# Patient Record
Sex: Female | Born: 1980 | Race: White | Hispanic: No | Marital: Single | Smoking: Current some day smoker
Health system: Southern US, Community
[De-identification: ages and names within clinical notes are randomized; demographics above are authoritative.]

## PROBLEM LIST (undated history)

## (undated) DIAGNOSIS — IMO0001 Reserved for inherently not codable concepts without codable children: Secondary | ICD-10-CM

## (undated) DIAGNOSIS — K219 Gastro-esophageal reflux disease without esophagitis: Secondary | ICD-10-CM

---

## 2001-02-19 ENCOUNTER — Other Ambulatory Visit: Admission: RE | Admit: 2001-02-19 | Discharge: 2001-02-19 | Payer: Self-pay | Admitting: Family Medicine

## 2002-01-04 ENCOUNTER — Emergency Department (HOSPITAL_COMMUNITY): Admission: EM | Admit: 2002-01-04 | Discharge: 2002-01-04 | Payer: Self-pay | Admitting: Emergency Medicine

## 2002-10-25 ENCOUNTER — Emergency Department (HOSPITAL_COMMUNITY): Admission: EM | Admit: 2002-10-25 | Discharge: 2002-10-25 | Payer: Self-pay | Admitting: Emergency Medicine

## 2003-05-03 ENCOUNTER — Emergency Department (HOSPITAL_COMMUNITY): Admission: EM | Admit: 2003-05-03 | Discharge: 2003-05-03 | Payer: Self-pay | Admitting: Emergency Medicine

## 2009-06-06 ENCOUNTER — Emergency Department (HOSPITAL_BASED_OUTPATIENT_CLINIC_OR_DEPARTMENT_OTHER): Admission: EM | Admit: 2009-06-06 | Discharge: 2009-06-06 | Payer: Self-pay | Admitting: Emergency Medicine

## 2011-01-25 ENCOUNTER — Encounter: Payer: Self-pay | Admitting: *Deleted

## 2011-01-25 ENCOUNTER — Emergency Department (HOSPITAL_BASED_OUTPATIENT_CLINIC_OR_DEPARTMENT_OTHER)
Admission: EM | Admit: 2011-01-25 | Discharge: 2011-01-25 | Disposition: A | Discharge status: Home / Self Care | Payer: Self-pay | Attending: Emergency Medicine | Admitting: Emergency Medicine

## 2011-01-25 DIAGNOSIS — K219 Gastro-esophageal reflux disease without esophagitis: Secondary | ICD-10-CM | POA: Insufficient documentation

## 2011-01-25 DIAGNOSIS — K047 Periapical abscess without sinus: Secondary | ICD-10-CM | POA: Insufficient documentation

## 2011-01-25 DIAGNOSIS — Z79899 Other long term (current) drug therapy: Secondary | ICD-10-CM | POA: Insufficient documentation

## 2011-01-25 DIAGNOSIS — F17200 Nicotine dependence, unspecified, uncomplicated: Secondary | ICD-10-CM | POA: Insufficient documentation

## 2011-01-25 HISTORY — DX: Gastro-esophageal reflux disease without esophagitis: K21.9

## 2011-01-25 HISTORY — DX: Reserved for inherently not codable concepts without codable children: IMO0001

## 2011-01-25 MED ORDER — ONDANSETRON 4 MG PO TBDP: 4.0000 mg | ORAL_TABLET | Freq: Three times a day (TID) | ORAL | Status: AC | PRN

## 2011-01-25 MED ORDER — PENICILLIN V POTASSIUM 250 MG PO TABS: 250.0000 mg | ORAL_TABLET | Freq: Four times a day (QID) | ORAL | Status: AC

## 2011-01-25 MED ORDER — HYDROCODONE-ACETAMINOPHEN 5-500 MG PO TABS: 1.0000 | ORAL_TABLET | Freq: Four times a day (QID) | ORAL | Status: AC | PRN

## 2011-01-25 NOTE — ED Notes (Signed)
Patient states she has right lower abscessed tooth and swelling in her right side of her face for the last 2 days.

## 2011-01-25 NOTE — Discharge Instructions (Signed)
Abscessed Tooth A tooth abscess is a collection of infected fluid (pus) from a bacterial infection in the inner part of the tooth (pulp). It usually occurs at the end of the tooth's root.  CAUSES   A very bad cavity (extensive tooth decay).   Trauma to the tooth, such as a broken or chipped tooth, that allows bacteria to enter into the pulp.  SYMPTOMS  Severe pain in and around the infected tooth.   Swelling and redness around the abscessed tooth or in the mouth or face.   Tenderness.   Pus drainage.   Bad breath.   Bitter taste in the mouth.   Difficulty swallowing.   Difficulty opening the mouth.   Feeling sick to your stomach (nauseous).   Vomiting.   Chills.   Swollen neck glands.  DIAGNOSIS  A medical and dental history will be taken.   An examination will be performed by tapping on the abscessed tooth.   X-rays may be taken of the tooth to identify the abscess.  TREATMENT The goal of treatment is to eliminate the infection.   You may be prescribed antibiotic medicine to stop the infection from spreading.   A root canal may be performed to save the tooth. If the tooth cannot be saved, it may be pulled (extracted) and the abscess may be drained.  HOME CARE INSTRUCTIONS  Only take over-the-counter or prescription medicines for pain, fever, or discomfort as directed by your caregiver.   Do not drive after taking pain medicine (narcotics).   Rinse your mouth (gargle) often with salt water ( tsp salt in 8 oz of warm water) to relieve pain or swelling.   Do not apply heat to the outside of your face.   Return to your dentist for further treatment as directed.  SEEK IMMEDIATE DENTAL CARE IF:  You have a temperature by mouth above 102 F (38.9 C), not controlled by medicine.   You have chills or a very bad headache.   You have problems breathing or swallowing.   Your have trouble opening your mouth.   You develop swelling in the neck or around the eye.    Your pain is not helped by medicine.   Your pain is getting worse instead of better.  Document Released: 02/04/2005 Document Revised: 10/17/2010 Document Reviewed: 05/15/2010 [facility] Patient Information 2012 [place], [place].

## 2011-01-25 NOTE — ED Provider Notes (Signed)
History     CSN: [id number] Arrival date & time: 01/25/2011 10:27 AM   First MD Initiated Contact with Patient 01/25/11 1202      Chief Complaint  Patient presents with  . Dental Pain    (Consider location/radiation/quality/duration/timing/severity/associated sxs/prior treatment) HPI Comments: Pt states that she fracture her right lower teeth a while ago and now she is have pain and swelling to the area  Patient is a 30 y.o. female presenting with tooth pain. The history is provided by the patient. No language interpreter was used.  Dental PainThe primary symptoms include mouth pain. Primary symptoms do not include fever. The symptoms began 2 days ago. The symptoms are worsening. The symptoms are recurrent. The symptoms occur constantly.  Additional symptoms include: gum tenderness and facial swelling.    Past Medical History  Diagnosis Date  . Reflux     History reviewed. No pertinent past surgical history.  No family history on file.  History  Substance Use Topics  . Smoking status: Current Some Day Smoker -- 0.5 packs/day    Types: Cigarettes  . Smokeless tobacco: Not on file  . Alcohol Use: Yes     occassionally    OB History    Grav Para Term Preterm Abortions TAB SAB Ect Mult Living                  Review of Systems  Constitutional: Negative.  Negative for fever.  HENT: Positive for facial swelling.   Respiratory: Negative.   Cardiovascular: Negative.   Neurological: Negative.     Allergies  Review of patient's allergies indicates no known allergies.  Home Medications   Current Outpatient Rx  Name Route Sig Dispense Refill  . OMEGA-3 FATTY ACIDS 1000 MG PO CAPS Oral Take 2 g by mouth daily.      Marland Kitchen NORGESTIMATE-ETH ESTRADIOL 0.25-35 MG-MCG PO TABS Oral Take 1 tablet by mouth daily.      Marland Kitchen OMEPRAZOLE-SODIUM BICARBONATE 40-1100 MG PO CAPS Oral Take 1 capsule by mouth daily before breakfast.      . VITAMIN B-12 500 MCG PO TABS Oral Take 500 mcg by  mouth daily.      Marland Kitchen HYDROCODONE-ACETAMINOPHEN 5-500 MG PO TABS Oral Take 1-2 tablets by mouth every 6 (six) hours as needed for pain. 6 tablet 0  . ONDANSETRON 4 MG PO TBDP Oral Take 1 tablet (4 mg total) by mouth every 8 (eight) hours as needed for nausea. 10 tablet 0  . PENICILLIN V POTASSIUM 250 MG PO TABS Oral Take 1 tablet (250 mg total) by mouth 4 (four) times daily. 40 tablet 0    BP 154/93  Pulse 92  Temp(Src) 99.3 F (37.4 C) (Oral)  Resp 20  Ht 5\' 4"  (1.626 m)  Wt 200 lb (90.719 kg)  BMI 34.33 kg/m2  SpO2 100%  LMP 01/11/2011  Physical Exam  Nursing note and vitals reviewed. Constitutional: She appears well-developed and well-nourished.  HENT:  Right Ear: External ear normal.  Left Ear: External ear normal.       Pt has obvious decay to multiple right lower teeth:will swelling and a fracture noted to right lower jaw  Cardiovascular: Normal rate and regular rhythm.   Pulmonary/Chest: Effort normal and breath sounds normal.  Neurological: She is alert.    ED Course  Procedures (including critical care time)  Labs Reviewed - No data to display No results found.   1. Dental abscess       MDM  Pt  requesting nausea medication as pain medication upsets her stomach:no concern for ludwigs angina    Medical screening examination/treatment/procedure(s) were performed by non-physician practitioner and as supervising physician I was immediately available for consultation/collaboration. Osvaldo Human, M.D.     Teressa Lower, NP 01/25/11 1215  Carleene Sharples III, MD 01/25/11 2106

## 2013-08-22 ENCOUNTER — Emergency Department (HOSPITAL_BASED_OUTPATIENT_CLINIC_OR_DEPARTMENT_OTHER)
Admission: EM | Admit: 2013-08-22 | Discharge: 2013-08-22 | Discharge status: Against Medical Advice | Payer: Self-pay | Attending: Emergency Medicine | Admitting: Emergency Medicine

## 2013-08-22 ENCOUNTER — Encounter (HOSPITAL_BASED_OUTPATIENT_CLINIC_OR_DEPARTMENT_OTHER): Payer: Self-pay | Admitting: Emergency Medicine

## 2013-08-22 DIAGNOSIS — M79609 Pain in unspecified limb: Secondary | ICD-10-CM | POA: Insufficient documentation

## 2013-08-22 DIAGNOSIS — F17200 Nicotine dependence, unspecified, uncomplicated: Secondary | ICD-10-CM | POA: Insufficient documentation

## 2013-08-22 DIAGNOSIS — Z9119 Patient's noncompliance with other medical treatment and regimen: Secondary | ICD-10-CM

## 2013-08-22 DIAGNOSIS — Z5320 Procedure and treatment not carried out because of patient's decision for unspecified reasons: Secondary | ICD-10-CM

## 2013-08-22 NOTE — ED Notes (Signed)
Reports pain to left leg behind left knee. Pt has spider veins noticeable. Pt reports taking 1 ASA pta.

## 2013-09-02 NOTE — ED Provider Notes (Signed)
CSN: [id number]     Arrival date & time 08/22/13  1758 History   First MD Initiated Contact with Patient 08/22/13 2103     Chief Complaint  Patient presents with  . Leg Pain     (Consider location/radiation/quality/duration/timing/severity/associated sxs/prior Treatment) HPI  Past Medical History  Diagnosis Date  . Reflux    History reviewed. No pertinent past surgical history. No family history on file. History  Substance Use Topics  . Smoking status: Current Some Day Smoker -- 0.50 packs/day    Types: Cigarettes  . Smokeless tobacco: Not on file  . Alcohol Use: Yes     Comment: occassionally   OB History   Grav Para Term Preterm Abortions TAB SAB Ect Mult Living                 Review of Systems    Allergies  Review of patient's allergies indicates no known allergies.  Home Medications   Prior to Admission medications   Medication Sig Start Date End Date Taking? Authorizing Provider  fish oil-omega-3 fatty acids 1000 MG capsule Take 2 g by mouth daily.      Historical Provider, MD  norgestimate-ethinyl estradiol (ORTHO-CYCLEN,SPRINTEC,PREVIFEM) 0.25-35 MG-MCG tablet Take 1 tablet by mouth daily.      Historical Provider, MD  omeprazole-sodium bicarbonate (ZEGERID) 40-1100 MG per capsule Take 1 capsule by mouth daily before breakfast.      Historical Provider, MD  vitamin B-12 (CYANOCOBALAMIN) 500 MCG tablet Take 500 mcg by mouth daily.      Historical Provider, MD   BP 125/74  Pulse 80  Temp(Src) 98.7 F (37.1 C) (Oral)  Resp 18  Ht 5\' 4"  (1.626 m)  Wt 155 lb (70.308 kg)  BMI 26.59 kg/m2  SpO2 100%  LMP 08/22/2013 Physical Exam  ED Course  Procedures (including critical care time) Labs Review Labs Reviewed - No data to display  Imaging Review No results found.   EKG Interpretation None      MDM   Final diagnoses:  Left before treatment completed    Pt left after triage, before being seen by a medical provider.     Shanna CiscoMegan E Docherty,  MD 09/02/13 [phone]

## 2014-10-22 ENCOUNTER — Emergency Department (HOSPITAL_COMMUNITY)
Admission: EM | Admit: 2014-10-22 | Discharge: 2014-10-22 | Disposition: A | Discharge status: Home / Self Care | Payer: Self-pay | Attending: Emergency Medicine | Admitting: Emergency Medicine

## 2014-10-22 ENCOUNTER — Encounter (HOSPITAL_COMMUNITY): Payer: Self-pay | Admitting: Emergency Medicine

## 2014-10-22 DIAGNOSIS — Z880 Allergy status to penicillin: Secondary | ICD-10-CM | POA: Insufficient documentation

## 2014-10-22 DIAGNOSIS — Z79899 Other long term (current) drug therapy: Secondary | ICD-10-CM | POA: Insufficient documentation

## 2014-10-22 DIAGNOSIS — R067 Sneezing: Secondary | ICD-10-CM | POA: Insufficient documentation

## 2014-10-22 DIAGNOSIS — J0100 Acute maxillary sinusitis, unspecified: Secondary | ICD-10-CM | POA: Insufficient documentation

## 2014-10-22 DIAGNOSIS — J039 Acute tonsillitis, unspecified: Secondary | ICD-10-CM | POA: Insufficient documentation

## 2014-10-22 DIAGNOSIS — Z720 Tobacco use: Secondary | ICD-10-CM | POA: Insufficient documentation

## 2014-10-22 DIAGNOSIS — Z793 Long term (current) use of hormonal contraceptives: Secondary | ICD-10-CM | POA: Insufficient documentation

## 2014-10-22 MED ORDER — CEPHALEXIN 500 MG PO CAPS: 500.0000 mg | ORAL_CAPSULE | Freq: Four times a day (QID) | ORAL | Status: AC

## 2014-10-22 MED ORDER — TRIAMCINOLONE ACETONIDE 55 MCG/ACT NA AERO: 2.0000 | INHALATION_SPRAY | Freq: Every day | NASAL | Status: AC

## 2014-10-22 NOTE — ED Notes (Signed)
PT c/o nasal congestion and sore throat with yellow drainage worsening over the past week. PT states she has been taking zyrtec at night and Claritin D in the am.

## 2014-10-22 NOTE — ED Provider Notes (Signed)
CSN: [id number]     Arrival date & time 10/22/14  1641 History   First MD Initiated Contact with Patient 10/22/14 1716     Chief Complaint  Patient presents with  . Nasal Congestion     (Consider location/radiation/quality/duration/timing/severity/associated sxs/prior Treatment) Patient is a 34 y.o. female presenting with URI. The history is provided by the patient.  URI Presenting symptoms: congestion, rhinorrhea and sore throat   Presenting symptoms: no cough and no fever   Severity:  Moderate Onset quality:  Gradual Duration:  3 weeks Timing:  Constant Progression:  Worsening Chronicity:  New Relieved by:  Nothing Worsened by:  Nothing tried Ineffective treatments:  Decongestant and OTC medications Associated symptoms: headaches, sinus pain and sneezing   Associated symptoms: no wheezing    Mackenzie Martin is a 34 y.o. female who presents to the ED with nasal congestion, sore throat that started 3 weeks ago and has gotten worse. She is taking Zyrtec at night and Claritin D in the mornings without relief. Patient states she had a bad sinus infection last year and this feels similar. She is tender around her eyes and has thick yellow nasal drainage.    Past Medical History  Diagnosis Date  . Reflux    History reviewed. No pertinent past surgical history. History reviewed. No pertinent family history. Social History  Substance Use Topics  . Smoking status: Current Some Day Smoker -- 0.50 packs/day    Types: Cigarettes  . Smokeless tobacco: None  . Alcohol Use: Yes     Comment: occassionally   OB History    Gravida Para Term Preterm AB TAB SAB Ectopic Multiple Living            0     Review of Systems  Constitutional: Negative for fever and chills.  HENT: Positive for congestion, rhinorrhea, sneezing and sore throat.   Eyes: Negative for redness and visual disturbance.  Respiratory: Negative for cough, shortness of breath and wheezing.   Genitourinary: Negative.    Skin: Negative for rash.  Neurological: Positive for headaches. Negative for syncope and light-headedness.  Psychiatric/Behavioral: Negative for confusion. The patient is not nervous/anxious.       Allergies  Amoxicillin  Home Medications   Prior to Admission medications   Medication Sig Start Date End Date Taking? Authorizing Provider  cephALEXin (KEFLEX) 500 MG capsule Take 1 capsule (500 mg total) by mouth 4 (four) times daily. 10/22/14   Hope Orlene Och, NP  fish oil-omega-3 fatty acids 1000 MG capsule Take 2 g by mouth daily.      Historical Provider, MD  norgestimate-ethinyl estradiol (ORTHO-CYCLEN,SPRINTEC,PREVIFEM) 0.25-35 MG-MCG tablet Take 1 tablet by mouth daily.      Historical Provider, MD  omeprazole-sodium bicarbonate (ZEGERID) 40-1100 MG per capsule Take 1 capsule by mouth daily before breakfast.      Historical Provider, MD  triamcinolone (NASACORT AQ) 55 MCG/ACT AERO nasal inhaler Place 2 sprays into the nose daily. 10/22/14   Hope Orlene Och, NP  vitamin B-12 (CYANOCOBALAMIN) 500 MCG tablet Take 500 mcg by mouth daily.      Historical Provider, MD   BP 110/54 mmHg  Pulse 88  Temp(Src) 97.9 F (36.6 C) (Oral)  Resp 18  Ht  (1.626 m)  Wt 156 lb (70.761 kg)  BMI 26.76 kg/m2  SpO2 100%  LMP 10/04/2014 Physical Exam  Constitutional: She is oriented to person, place, and time. She appears well-developed and well-nourished.  HENT:  Head: Normocephalic.  Right Ear:  Tympanic membrane normal.  Left Ear: Tympanic membrane normal.  Nose: Mucosal edema and rhinorrhea present. Right sinus exhibits maxillary sinus tenderness and frontal sinus tenderness. Left sinus exhibits maxillary sinus tenderness and frontal sinus tenderness.  Mouth/Throat: Uvula is midline and mucous membranes are normal.  Kissing tonsils  Eyes: Conjunctivae and EOM are normal. Pupils are equal, round, and reactive to light.  Neck: Neck supple.  Cardiovascular: Normal rate and regular rhythm.    Pulmonary/Chest: Effort normal and breath sounds normal.  Abdominal: Soft. There is no tenderness.  Musculoskeletal: Normal range of motion.  Lymphadenopathy:    She has cervical adenopathy.  Neurological: She is alert and oriented to person, place, and time. No cranial nerve deficit.  Skin: Skin is warm and dry.  Psychiatric: She has a normal mood and affect. Her behavior is normal.  Nursing note and vitals reviewed.   ED Course  Procedures  MDM  34 y.o. female with sinus tenderness, congestion, enlarged tonsils x 3 weeks. Stable for d/c without tonsillar abscess or difficulty swallowing. Will treat for infection and she will follow up with ENT. She will return here as needed for worsening symptoms.   Final diagnoses:  Acute maxillary sinusitis, recurrence not specified  Tonsillitis        Janne Napoleon, NP 10/22/14 1803  Samuel Jester, DO 10/25/14 1756

## 2014-10-22 NOTE — Discharge Instructions (Signed)
Follow up with Dr. Suszanne Conners.  Return here as needed.

## 2014-10-22 NOTE — ED Notes (Signed)
Pt made aware to return if symptoms worsen or if any life threatening symptoms occur.   

## 2015-06-13 ENCOUNTER — Encounter (HOSPITAL_COMMUNITY): Payer: Self-pay

## 2015-06-13 ENCOUNTER — Emergency Department (HOSPITAL_COMMUNITY)
Admission: EM | Admit: 2015-06-13 | Discharge: 2015-06-13 | Disposition: A | Discharge status: Home / Self Care | Payer: Self-pay | Attending: Emergency Medicine | Admitting: Emergency Medicine

## 2015-06-13 DIAGNOSIS — K219 Gastro-esophageal reflux disease without esophagitis: Secondary | ICD-10-CM | POA: Insufficient documentation

## 2015-06-13 DIAGNOSIS — F17210 Nicotine dependence, cigarettes, uncomplicated: Secondary | ICD-10-CM | POA: Insufficient documentation

## 2015-06-13 DIAGNOSIS — Z880 Allergy status to penicillin: Secondary | ICD-10-CM | POA: Insufficient documentation

## 2015-06-13 DIAGNOSIS — Z793 Long term (current) use of hormonal contraceptives: Secondary | ICD-10-CM | POA: Insufficient documentation

## 2015-06-13 DIAGNOSIS — Z79899 Other long term (current) drug therapy: Secondary | ICD-10-CM | POA: Insufficient documentation

## 2015-06-13 DIAGNOSIS — Z7952 Long term (current) use of systemic steroids: Secondary | ICD-10-CM | POA: Insufficient documentation

## 2015-06-13 DIAGNOSIS — Y998 Other external cause status: Secondary | ICD-10-CM | POA: Insufficient documentation

## 2015-06-13 DIAGNOSIS — Z792 Long term (current) use of antibiotics: Secondary | ICD-10-CM | POA: Insufficient documentation

## 2015-06-13 DIAGNOSIS — W57XXXA Bitten or stung by nonvenomous insect and other nonvenomous arthropods, initial encounter: Secondary | ICD-10-CM | POA: Insufficient documentation

## 2015-06-13 DIAGNOSIS — S70362A Insect bite (nonvenomous), left thigh, initial encounter: Secondary | ICD-10-CM | POA: Insufficient documentation

## 2015-06-13 DIAGNOSIS — Y9289 Other specified places as the place of occurrence of the external cause: Secondary | ICD-10-CM | POA: Insufficient documentation

## 2015-06-13 DIAGNOSIS — Y9389 Activity, other specified: Secondary | ICD-10-CM | POA: Insufficient documentation

## 2015-06-13 MED ORDER — TETANUS-DIPHTH-ACELL PERTUSSIS 5-2.5-18.5 LF-MCG/0.5 IM SUSP
0.5000 mL | Freq: Once | INTRAMUSCULAR | Status: DC
  Filled 2015-06-13: qty 0.5

## 2015-06-13 MED ORDER — DOXYCYCLINE HYCLATE 100 MG PO CAPS: 100.0000 mg | ORAL_CAPSULE | Freq: Two times a day (BID) | ORAL | Status: AC

## 2015-06-13 NOTE — Discharge Instructions (Signed)
Tick Bite Information Ticks are insects that attach themselves to the skin and draw blood for food. There are various types of ticks. Common types include wood ticks and deer ticks. Most ticks live in shrubs and grassy areas. Ticks can climb onto your body when you make contact with leaves or grass where the tick is waiting. The most common places on the body for ticks to attach themselves are the scalp, neck, armpits, waist, and groin. Most tick bites are harmless, but sometimes ticks carry germs that cause diseases. These germs can be spread to a person during the tick's feeding process. The chance of a disease spreading through a tick bite depends on:   The type of tick.  Time of year.   How long the tick is attached.   Geographic location.  HOW CAN YOU PREVENT TICK BITES? Take these steps to help prevent tick bites when you are outdoors:  Wear protective clothing. Long sleeves and long pants are best.   Wear white clothes so you can see ticks more easily.  Tuck your pant legs into your socks.   If walking on a trail, stay in the middle of the trail to avoid brushing against bushes.  Avoid walking through areas with long grass.  Put insect repellent on all exposed skin and along boot tops, pant legs, and sleeve cuffs.   Check clothing, hair, and skin repeatedly and before going inside.   Brush off any ticks that are not attached.  Take a shower or bath as soon as possible after being outdoors.  WHAT IS THE PROPER WAY TO REMOVE A TICK? Ticks should be removed as soon as possible to help prevent diseases caused by tick bites. 1. If latex gloves are available, put them on before trying to remove a tick.  2. Using fine-point tweezers, grasp the tick as close to the skin as possible. You may also use curved forceps or a tick removal tool. Grasp the tick as close to its head as possible. Avoid grasping the tick on its body. 3. Pull gently with steady upward pressure until  the tick lets go. Do not twist the tick or jerk it suddenly. This may break off the tick's head or mouth parts. 4. Do not squeeze or crush the tick's body. This could force disease-carrying fluids from the tick into your body.  5. After the tick is removed, wash the bite area and your hands with soap and water or other disinfectant such as alcohol. 6. Apply a small amount of antiseptic cream or ointment to the bite site.  7. Wash and disinfect any instruments that were used.  Do not try to remove a tick by applying a hot match, petroleum jelly, or fingernail polish to the tick. These methods do not work and may increase the chances of disease being spread from the tick bite.  WHEN SHOULD YOU SEEK MEDICAL CARE? Contact your health care provider if you are unable to remove a tick from your skin or if a part of the tick breaks off and is stuck in the skin.  After a tick bite, you need to be aware of signs and symptoms that could be related to diseases spread by ticks. Contact your health care provider if you develop any of the following in the days or weeks after the tick bite:  Unexplained fever.  Rash. A circular rash that appears days or weeks after the tick bite may indicate the possibility of Lyme disease. The rash may resemble   a target with a bull's-eye and may occur at a different part of your body than the tick bite.  Redness and swelling in the area of the tick bite.   Tender, swollen lymph glands.   Diarrhea.   Weight loss.   Cough.   Fatigue.   Muscle, joint, or bone pain.   Abdominal pain.   Headache.   Lethargy or a change in your level of consciousness.  Difficulty walking or moving your legs.   Numbness in the legs.   Paralysis.  Shortness of breath.   Confusion.   Repeated vomiting.    This information is not intended to replace advice given to you by your health care provider. Make sure you discuss any questions you have with your health  care provider.   Document Released: 02/02/2000 Document Revised: 02/25/2014 Document Reviewed: 07/15/2012 Elsevier Interactive Patient Education 2016 [employer]  

## 2015-06-13 NOTE — ED Notes (Signed)
Pt here with tick bite rash.  Pulled off on Saturday.  Mowed grass on Friday.  Area is red and hot.  Elevated.

## 2015-06-13 NOTE — ED Notes (Signed)
Patient was alert, oriented and stable upon discharge. RN went over AVS and patient had no further questions.  

## 2015-06-13 NOTE — ED Provider Notes (Signed)
CSN: [id number]     Arrival date & time 06/13/15  1626 History  By signing my name below, I, Emmanuella Mensah, attest that this documentation has been prepared under the direction and in the presence of Will Reyanna Baley, PA-C. Electronically Signed: Angelene GiovanniEmmanuella Mensah, ED Scribe. 06/13/2015. 6:48 PM.     Chief Complaint  Patient presents with  . Rash   The history is provided by the patient. No language interpreter was used.   HPI Comments: Mackenzie Martin is a 35 y.o. female who presents to the Emergency Department complaining of gradually improving non-itchy rash to the face, neck, and right torso onset 2 days ago. She explains that she saw a tick on her left lateral thigh after mowing the lawn 4 days ago and she pulled it off.  No alleviating factors noted. Pt has not tried any medications PTA. She reports NKDA, denying allergy to Amoxicillin. She denies any fever, generalized body aches, n/v/d, cough, or SOB.   Past Medical History  Diagnosis Date  . Reflux    History reviewed. No pertinent past surgical history. History reviewed. No pertinent family history. Social History  Substance Use Topics  . Smoking status: Current Some Day Smoker -- 0.50 packs/day    Types: Cigarettes  . Smokeless tobacco: None  . Alcohol Use: Yes     Comment: occassionally   OB History    Gravida Para Term Preterm AB TAB SAB Ectopic Multiple Living            0     Review of Systems  Constitutional: Negative for fever and chills.  HENT: Negative for trouble swallowing.   Respiratory: Negative for shortness of breath.   Gastrointestinal: Negative for nausea, vomiting and diarrhea.  Genitourinary: Negative for dysuria.  Musculoskeletal: Negative for myalgias, arthralgias, neck pain and neck stiffness.  Skin: Positive for rash.  Neurological: Negative for headaches.      Allergies  Amoxicillin  Home Medications   Prior to Admission medications   Medication Sig Start Date End Date Taking?  Authorizing Provider  cephALEXin (KEFLEX) 500 MG capsule Take 1 capsule (500 mg total) by mouth 4 (four) times daily. 10/22/14   Hope Orlene OchM Neese, NP  doxycycline (VIBRAMYCIN) 100 MG capsule Take 1 capsule (100 mg total) by mouth 2 (two) times daily. 06/13/15   Everlene FarrierWilliam Elnore Cosens, PA-C  fish oil-omega-3 fatty acids 1000 MG capsule Take 2 g by mouth daily.      Historical Provider, MD  norgestimate-ethinyl estradiol (ORTHO-CYCLEN,SPRINTEC,PREVIFEM) 0.25-35 MG-MCG tablet Take 1 tablet by mouth daily.      Historical Provider, MD  omeprazole-sodium bicarbonate (ZEGERID) 40-1100 MG per capsule Take 1 capsule by mouth daily before breakfast.      Historical Provider, MD  triamcinolone (NASACORT AQ) 55 MCG/ACT AERO nasal inhaler Place 2 sprays into the nose daily. 10/22/14   Hope Orlene OchM Neese, NP  vitamin B-12 (CYANOCOBALAMIN) 500 MCG tablet Take 500 mcg by mouth daily.      Historical Provider, MD   BP 154/77 mmHg  Pulse 68  Temp(Src) 98.4 F (36.9 C) (Oral)  Resp 16  SpO2 100%  LMP 06/13/2015 (LMP Unknown) Physical Exam  Constitutional: She appears well-developed and well-nourished. No distress.  Nontoxic appearing.  HENT:  Head: Normocephalic and atraumatic.  Right Ear: External ear normal.  Left Ear: External ear normal.  Mouth/Throat: Oropharynx is clear and moist. No oropharyngeal exudate.  Eyes: Conjunctivae are normal. Pupils are equal, round, and reactive to light. Right eye exhibits no discharge. Left  eye exhibits no discharge.  Neck: Neck supple.  Cardiovascular: Normal rate, regular rhythm, normal heart sounds and intact distal pulses.   Pulmonary/Chest: Effort normal and breath sounds normal. No respiratory distress. She has no wheezes. She has no rales.  Abdominal: Soft. There is no tenderness.  Musculoskeletal: She exhibits no edema.  Lymphadenopathy:    She has no cervical adenopathy.  Neurological: She is alert. Coordination normal.  Skin: Skin is warm and dry. Rash noted. She is not  diaphoretic. No pallor.  1 cm area of erythema to left upper thigh, no surrounding erythema. No target lesions. No erythema migrans. There is a raised area to her right cheek that appears dry and scaly. No erythema.   Psychiatric: She has a normal mood and affect. Her behavior is normal.  Nursing note and vitals reviewed.   ED Course  Procedures (including critical care time) DIAGNOSTIC STUDIES: Oxygen Saturation is 100% on RA, normal by my interpretation.    COORDINATION OF CARE: 6:44 PM- Pt advised of plan for treatment and pt agrees. Will consult attending for further evaluation and treatment.   MDM   Meds given in ED:  Medications - No data to display  New Prescriptions   DOXYCYCLINE (VIBRAMYCIN) 100 MG CAPSULE    Take 1 capsule (100 mg total) by mouth 2 (two) times daily.    Final diagnoses:  Tick bite   This  is a 35 y.o. female who presents to the Emergency Department complaining of gradually improving non-itchy rash to the face, neck, and right torso onset 2 days ago. She explains that she saw a tick on her left lateral thigh after mowing the lawn 4 days ago and she pulled it off.  No alleviating factors noted. Pt has not tried any medications PTA.  On exam the patient is afebrile nontoxic appearing. She is in no body aches. There is a small one similar area of erythema to her thigh. No target lesions. No erythema migrans. Patient also has a small raised area to her right cheek. Appears dry and scaly. No erythema. I doubt these are related. After discussion with my attending, Dr. Silverio Lay, will discharge with doxycycline and close follow up with primary care. I advised the patient to follow-up with their primary care provider this week. I advised the patient to return to the emergency department with new or worsening symptoms or new concerns. The patient verbalized understanding and agreement with plan.   This patient was discussed with Dr. Silverio Lay who agrees with assessment and plan.    I personally performed the services described in this documentation, which was scribed in my presence. The recorded information has been reviewed and is accurate.     Everlene Farrier, PA-C 06/13/15 1912  Richardean Canal, MD 06/15/15 [phone]

## 2018-01-06 ENCOUNTER — Emergency Department (HOSPITAL_COMMUNITY)
Admission: EM | Admit: 2018-01-06 | Discharge: 2018-01-06 | Disposition: A | Discharge status: Home / Self Care | Payer: Self-pay | Attending: Emergency Medicine | Admitting: Emergency Medicine

## 2018-01-06 ENCOUNTER — Encounter (HOSPITAL_COMMUNITY): Payer: Self-pay | Admitting: Emergency Medicine

## 2018-01-06 ENCOUNTER — Other Ambulatory Visit: Payer: Self-pay

## 2018-01-06 DIAGNOSIS — Z79899 Other long term (current) drug therapy: Secondary | ICD-10-CM | POA: Insufficient documentation

## 2018-01-06 DIAGNOSIS — F17210 Nicotine dependence, cigarettes, uncomplicated: Secondary | ICD-10-CM | POA: Insufficient documentation

## 2018-01-06 DIAGNOSIS — B9789 Other viral agents as the cause of diseases classified elsewhere: Secondary | ICD-10-CM | POA: Insufficient documentation

## 2018-01-06 DIAGNOSIS — J069 Acute upper respiratory infection, unspecified: Secondary | ICD-10-CM | POA: Insufficient documentation

## 2018-01-06 MED ORDER — BENZONATATE 200 MG PO CAPS: 200.0000 mg | ORAL_CAPSULE | Freq: Three times a day (TID) | ORAL | 0 refills | Status: AC | PRN

## 2018-01-06 MED ORDER — PREDNISONE 20 MG PO TABS: 40.0000 mg | ORAL_TABLET | Freq: Every day | ORAL | 0 refills | Status: AC

## 2018-01-06 NOTE — ED Triage Notes (Signed)
Pt c/o of productive cough, sore throat, fever, chills x 3 days.  Pt was previously treated for strep

## 2018-01-06 NOTE — Discharge Instructions (Addendum)
Drink plenty of fluids.  You may take Tylenol or ibuprofen if needed for body aches or fever.  Take the prednisone as prescribed until its finished.  Follow-up with your primary provider or return to the ER for any worsening symptoms.

## 2018-01-06 NOTE — ED Provider Notes (Signed)
[facility] EMERGENCY DEPARTMENT Provider Note   CSN: [id number] Arrival date & time: 01/06/18  1622     History   Chief Complaint Chief Complaint  Patient presents with  . Cough    HPI Mackenzie Martin is a 37 y.o. female.  HPI   Mackenzie Martin is a 37 y.o. female who presents to the Emergency Department complaining of productive cough, low-grade fever, chills, and nasal congestion.  Symptoms have been present for 3 days.  She states the cough is intermittently productive.  She has been taking over-the-counter cough medications with minimal relief.  She states that she has recently completed a round of antibiotics for strep throat that was diagnosed at the end of October.  She states initially her symptoms were improving, but did not fully resolve.  She denies chest pain, shortness of breath, vomiting or hemoptysis.   Past Medical History:  Diagnosis Date  . Reflux     There are no active problems to display for this patient.   History reviewed. No pertinent surgical history.   OB History    Gravida      Para      Term      Preterm      AB      Living  0     SAB      TAB      Ectopic      Multiple      Live Births               Home Medications    Prior to Admission medications   Medication Sig Start Date End Date Taking? Authorizing Provider  benzonatate (TESSALON) 200 MG capsule Take 1 capsule (200 mg total) by mouth 3 (three) times daily as needed for cough. Swallow whole, do not chew 01/06/18   Kylar Leonhardt, PA-C  cephALEXin (KEFLEX) 500 MG capsule Take 1 capsule (500 mg total) by mouth 4 (four) times daily. 10/22/14   Janne Napoleon, NP  doxycycline (VIBRAMYCIN) 100 MG capsule Take 1 capsule (100 mg total) by mouth 2 (two) times daily. 06/13/15   Everlene Farrier, PA-C  fish oil-omega-3 fatty acids 1000 MG capsule Take 2 g by mouth daily.      [provider]  norgestimate-ethinyl estradiol (ORTHO-CYCLEN,SPRINTEC,PREVIFEM)  0.25-35 MG-MCG tablet Take 1 tablet by mouth daily.      [provider]  omeprazole-sodium bicarbonate (ZEGERID) 40-1100 MG per capsule Take 1 capsule by mouth daily before breakfast.      [provider]  predniSONE (DELTASONE) 20 MG tablet Take 2 tablets (40 mg total) by mouth daily. 01/06/18   Anesha Hackert, PA-C  triamcinolone (NASACORT AQ) 55 MCG/ACT AERO nasal inhaler Place 2 sprays into the nose daily. 10/22/14   Janne Napoleon, NP  vitamin B-12 (CYANOCOBALAMIN) 500 MCG tablet Take 500 mcg by mouth daily.      [provider]    Family History No family history on file.  Social History Social History   Tobacco Use  . Smoking status: Current Some Day Smoker    Packs/day: 0.50    Types: Cigarettes  . Smokeless tobacco: Never Used  Substance Use Topics  . Alcohol use: Yes    Comment: occassionally  . Drug use: No     Allergies   Patient has no active allergies.   Review of Systems Review of Systems  Constitutional: Positive for chills and fever. Negative for activity change and appetite change.  HENT: Positive  for congestion. Negative for ear pain, sore throat and trouble swallowing.   Respiratory: Positive for cough. Negative for chest tightness, shortness of breath and wheezing.   Cardiovascular: Negative for chest pain.  Gastrointestinal: Negative for abdominal pain, nausea and vomiting.  Genitourinary: Negative for dysuria.  Musculoskeletal: Negative for arthralgias.  Skin: Negative for rash.  Neurological: Negative for dizziness, weakness, numbness and headaches.  Hematological: Negative for adenopathy.     Physical Exam Updated Vital Signs BP 130/87 (BP Location: Right Arm)   Pulse 73   Temp 98.4 F (36.9 C) (Oral)   Resp 12   Ht 5\' 4"  (1.626 m)   Wt 74.8 kg   LMP 01/06/2018   SpO2 100%   BMI 28.32 kg/m   Physical Exam  Constitutional: She appears well-developed and well-nourished. No distress.  HENT:  Head:  Normocephalic and atraumatic.  Right Ear: Tympanic membrane and ear canal normal.  Left Ear: Tympanic membrane and ear canal normal.  Nose: Mucosal edema present. No rhinorrhea.  Mouth/Throat: Uvula is midline, oropharynx is clear and moist and mucous membranes are normal. No oral lesions. No uvula swelling. No oropharyngeal exudate, posterior oropharyngeal edema, posterior oropharyngeal erythema or tonsillar abscesses. No tonsillar exudate.  Eyes: Conjunctivae are normal.  Neck: Normal range of motion and phonation normal. Neck supple.  Cardiovascular: Normal rate, regular rhythm and intact distal pulses.  No murmur heard. Pulmonary/Chest: Effort normal and breath sounds normal. No respiratory distress. She has no wheezes. She has no rales.  Abdominal: Soft. She exhibits no distension. There is no tenderness. There is no rebound and no guarding.  Musculoskeletal: She exhibits no edema.  Lymphadenopathy:    She has no cervical adenopathy.  Neurological: She is alert. No sensory deficit.  Skin: Skin is warm and dry. Capillary refill takes less than 2 seconds.  Nursing note and vitals reviewed.    ED Treatments / Results  Labs (all labs ordered are listed, but only abnormal results are displayed) Labs Reviewed - No data to display  EKG None  Radiology No results found.  Procedures Procedures (including critical care time)  Medications Ordered in ED Medications - No data to display   Initial Impression / Assessment and Plan / ED Course  I have reviewed the triage vital signs and the nursing notes.  Pertinent labs & imaging results that were available during my care of the patient were reviewed by me and considered in my medical decision making (see chart for details).     Patient well-appearing.  Vital signs reassuring.  Patient recently completed course of antibiotics for strep throat.  I feel her current symptoms are viral.  She agrees to treatment plan with Tessalon and  prednisone.  Appears appropriate for discharge home, agrees to Tylenol and/or ibuprofen if needed.  Return precautions discussed.  Final Clinical Impressions(s) / ED Diagnoses   Final diagnoses:  Viral URI with cough    ED Discharge Orders         Ordered    benzonatate (TESSALON) 200 MG capsule  3 times daily PRN     01/06/18 1742    predniSONE (DELTASONE) 20 MG tablet  Daily     01/06/18 1742           Pauline Ausriplett, Peace Noyes, PA-C 01/06/18 1823    Vanetta MuldersZackowski, Scott, MD 01/06/18 1858

## 2018-02-20 ENCOUNTER — Encounter (HOSPITAL_COMMUNITY): Payer: Self-pay | Admitting: Emergency Medicine

## 2018-02-20 ENCOUNTER — Other Ambulatory Visit: Payer: Self-pay

## 2018-02-20 ENCOUNTER — Emergency Department (HOSPITAL_COMMUNITY)
Admission: EM | Admit: 2018-02-20 | Discharge: 2018-02-20 | Disposition: A | Discharge status: Home / Self Care | Payer: Self-pay | Attending: Emergency Medicine | Admitting: Emergency Medicine

## 2018-02-20 ENCOUNTER — Emergency Department (HOSPITAL_COMMUNITY): Payer: Self-pay

## 2018-02-20 DIAGNOSIS — J0110 Acute frontal sinusitis, unspecified: Secondary | ICD-10-CM | POA: Insufficient documentation

## 2018-02-20 DIAGNOSIS — Z79899 Other long term (current) drug therapy: Secondary | ICD-10-CM | POA: Insufficient documentation

## 2018-02-20 DIAGNOSIS — J9801 Acute bronchospasm: Secondary | ICD-10-CM | POA: Insufficient documentation

## 2018-02-20 DIAGNOSIS — F17210 Nicotine dependence, cigarettes, uncomplicated: Secondary | ICD-10-CM | POA: Insufficient documentation

## 2018-02-20 MED ORDER — AMOXICILLIN 500 MG PO CAPS: 500.0000 mg | ORAL_CAPSULE | Freq: Three times a day (TID) | ORAL | 0 refills | Status: AC

## 2018-02-20 MED ORDER — AEROCHAMBER PLUS FLO-VU MEDIUM MISC
1.0000 | Freq: Once | Status: DC
  Filled 2018-02-20: qty 1

## 2018-02-20 MED ORDER — ALBUTEROL SULFATE HFA 108 (90 BASE) MCG/ACT IN AERS
2.0000 | INHALATION_SPRAY | Freq: Once | RESPIRATORY_TRACT | Status: AC
  Administered 2018-02-20: 2 via RESPIRATORY_TRACT
  Filled 2018-02-20: qty 6.7

## 2018-02-20 NOTE — ED Triage Notes (Signed)
Patient complaining of cough, generalized body aches, and fever since x 5 days. States she is coughing up green sputum.

## 2018-02-20 NOTE — ED Provider Notes (Signed)
[facility] EMERGENCY DEPARTMENT Provider Note   CSN: [id number] Arrival date & time: 02/20/18  0802     History   Chief Complaint Chief Complaint  Patient presents with  . Cough    HPI Mackenzie Martin is a 38 y.o. female with no significant past medical history presenting with a 1 week history of nasal congestion with thick green nasal discharge and post nasal drip, cough without shortness of breath but describing a tight sensation with deep inspiration along with subjective fever.  She has a mild sore throat but feels this is due to increased drainage.  She has been using sudafed and cough drops without relief of symptoms.  The history is provided by the patient.    Past Medical History:  Diagnosis Date  . Reflux     There are no active problems to display for this patient.   History reviewed. No pertinent surgical history.   OB History    Gravida      Para      Term      Preterm      AB      Living  0     SAB      TAB      Ectopic      Multiple      Live Births               Home Medications    Prior to Admission medications   Medication Sig Start Date End Date Taking? Authorizing Provider  amoxicillin (AMOXIL) 500 MG capsule Take 1 capsule (500 mg total) by mouth 3 (three) times daily for 10 days. 02/20/18 03/02/18  Burgess Amor, PA-C  benzonatate (TESSALON) 200 MG capsule Take 1 capsule (200 mg total) by mouth 3 (three) times daily as needed for cough. Swallow whole, do not chew 01/06/18   Triplett, Tammy, PA-C  cephALEXin (KEFLEX) 500 MG capsule Take 1 capsule (500 mg total) by mouth 4 (four) times daily. 10/22/14   Janne Napoleon, NP  doxycycline (VIBRAMYCIN) 100 MG capsule Take 1 capsule (100 mg total) by mouth 2 (two) times daily. 06/13/15   Everlene Farrier, PA-C  fish oil-omega-3 fatty acids 1000 MG capsule Take 2 g by mouth daily.      [provider]  norgestimate-ethinyl estradiol (ORTHO-CYCLEN,SPRINTEC,PREVIFEM) 0.25-35 MG-MCG  tablet Take 1 tablet by mouth daily.      [provider]  omeprazole-sodium bicarbonate (ZEGERID) 40-1100 MG per capsule Take 1 capsule by mouth daily before breakfast.      [provider]  predniSONE (DELTASONE) 20 MG tablet Take 2 tablets (40 mg total) by mouth daily. 01/06/18   Triplett, Tammy, PA-C  triamcinolone (NASACORT AQ) 55 MCG/ACT AERO nasal inhaler Place 2 sprays into the nose daily. 10/22/14   Janne Napoleon, NP  vitamin B-12 (CYANOCOBALAMIN) 500 MCG tablet Take 500 mcg by mouth daily.      [provider]    Family History History reviewed. No pertinent family history.  Social History Social History   Tobacco Use  . Smoking status: Current Some Day Smoker    Packs/day: 0.50    Types: Cigarettes  . Smokeless tobacco: Never Used  Substance Use Topics  . Alcohol use: Yes    Comment: occassionally  . Drug use: No     Allergies   Patient has no active allergies.   Review of Systems Review of Systems  Constitutional: Positive for chills and fever.  HENT: Positive for congestion, postnasal  drip, rhinorrhea, sinus pressure, sinus pain and sore throat. Negative for ear pain, nosebleeds, trouble swallowing and voice change.   Eyes: Negative for discharge.  Respiratory: Positive for cough and chest tightness. Negative for shortness of breath, wheezing and stridor.   Cardiovascular: Negative for chest pain and palpitations.  Gastrointestinal: Negative for abdominal pain, nausea and vomiting.  Genitourinary: Negative.      Physical Exam Updated Vital Signs BP 122/87 (BP Location: Left Arm)   Pulse 85   Temp 98.3 F (36.8 C) (Oral)   Resp 20   Ht 5\' 3"  (1.6 m)   Wt 72.6 kg   LMP 02/10/2018   SpO2 98%   BMI 28.34 kg/m   Physical Exam Constitutional:      Appearance: She is well-developed.  HENT:     Head: Normocephalic and atraumatic.     Right Ear: Tympanic membrane and ear canal normal.     Left Ear: Tympanic membrane and ear  canal normal.     Nose: Mucosal edema, congestion and rhinorrhea present.     Mouth/Throat:     Pharynx: Uvula midline. No oropharyngeal exudate or posterior oropharyngeal erythema.     Tonsils: No tonsillar abscesses.  Eyes:     Conjunctiva/sclera: Conjunctivae normal.  Cardiovascular:     Rate and Rhythm: Normal rate.     Heart sounds: Normal heart sounds.  Pulmonary:     Effort: Pulmonary effort is normal. No respiratory distress.     Breath sounds: No stridor. No wheezing, rhonchi or rales.     Comments: Distant breath sounds throughout.  No wheezing.  Chest:     Chest wall: No tenderness.  Abdominal:     Palpations: Abdomen is soft.     Tenderness: There is no abdominal tenderness.  Musculoskeletal: Normal range of motion.  Skin:    General: Skin is warm and dry.     Findings: No rash.  Neurological:     Mental Status: She is alert and oriented to person, place, and time.      ED Treatments / Results  Labs (all labs ordered are listed, but only abnormal results are displayed) Labs Reviewed - No data to display  EKG None  Radiology Dg Chest 2 View  Result Date: 02/20/2018 CLINICAL DATA:  Cough and fever. EXAM: CHEST - 2 VIEW COMPARISON:  None. FINDINGS: The heart size and mediastinal contours are within normal limits. Both lungs are clear. The visualized skeletal structures are unremarkable. IMPRESSION: Normal exam. Electronically Signed   By: Francene Boyers M.D.   On: 02/20/2018 08:29    Procedures Procedures (including critical care time)  Medications Ordered in ED Medications  AEROCHAMBER PLUS FLO-VU MEDIUM MISC 1 each (has no administration in time range)  albuterol (PROVENTIL HFA;VENTOLIN HFA) 108 (90 Base) MCG/ACT inhaler 2 puff (2 puffs Inhalation Given 02/20/18 0900)     Initial Impression / Assessment and Plan / ED Course  I have reviewed the triage vital signs and the nursing notes.  Pertinent labs & imaging results that were available during my care  of the patient were reviewed by me and considered in my medical decision making (see chart for details).     Patient with probable viral URI, but now with symptoms suggesting acute sinusitis.  She was encouraged to continue using her Sudafed, also discussed other home treatments including moisture or steam, Vicks, menthol cough drops.  She was placed on Amoxil.  She was also given an albuterol MDI here with improved aeration, cxr  clear, no pneumonia, suspect viral induced bronchospasm.    Final Clinical Impressions(s) / ED Diagnoses   Final diagnoses:  Acute non-recurrent frontal sinusitis  Bronchospasm    ED Discharge Orders         Ordered    amoxicillin (AMOXIL) 500 MG capsule  3 times daily     02/20/18 0901           Burgess Amordol, Halleigh Comes, PA-C 02/20/18 [id number]    Benjiman CorePickering, Nathan, MD 02/20/18 1407

## 2018-02-20 NOTE — Progress Notes (Signed)
**Note De-Identified Leaf [employer] Obfuscation** Patient educated on MDI delivery. Dial Check system to measure flow.

## 2018-02-20 NOTE — Discharge Instructions (Addendum)
Take the entire course of the antibiotics prescribed.  Continue using your Sudafed and any other treatments to help take the pain and pressure off your sinuses including menthol cough drops, Vicks, adding moisture to your air such as with a humidifier or sitting in a steamy bathroom.  You may use the albuterol 2 puffs every 4 hours if needed for cough.

## 2019-03-25 IMAGING — DX DG CHEST 2V
2 series · 2 of 2 positions shown · non-contrast
Comparison: None.

CLINICAL DATA: Cough and fever.

EXAM:
CHEST - 2 VIEW

[chest pa]
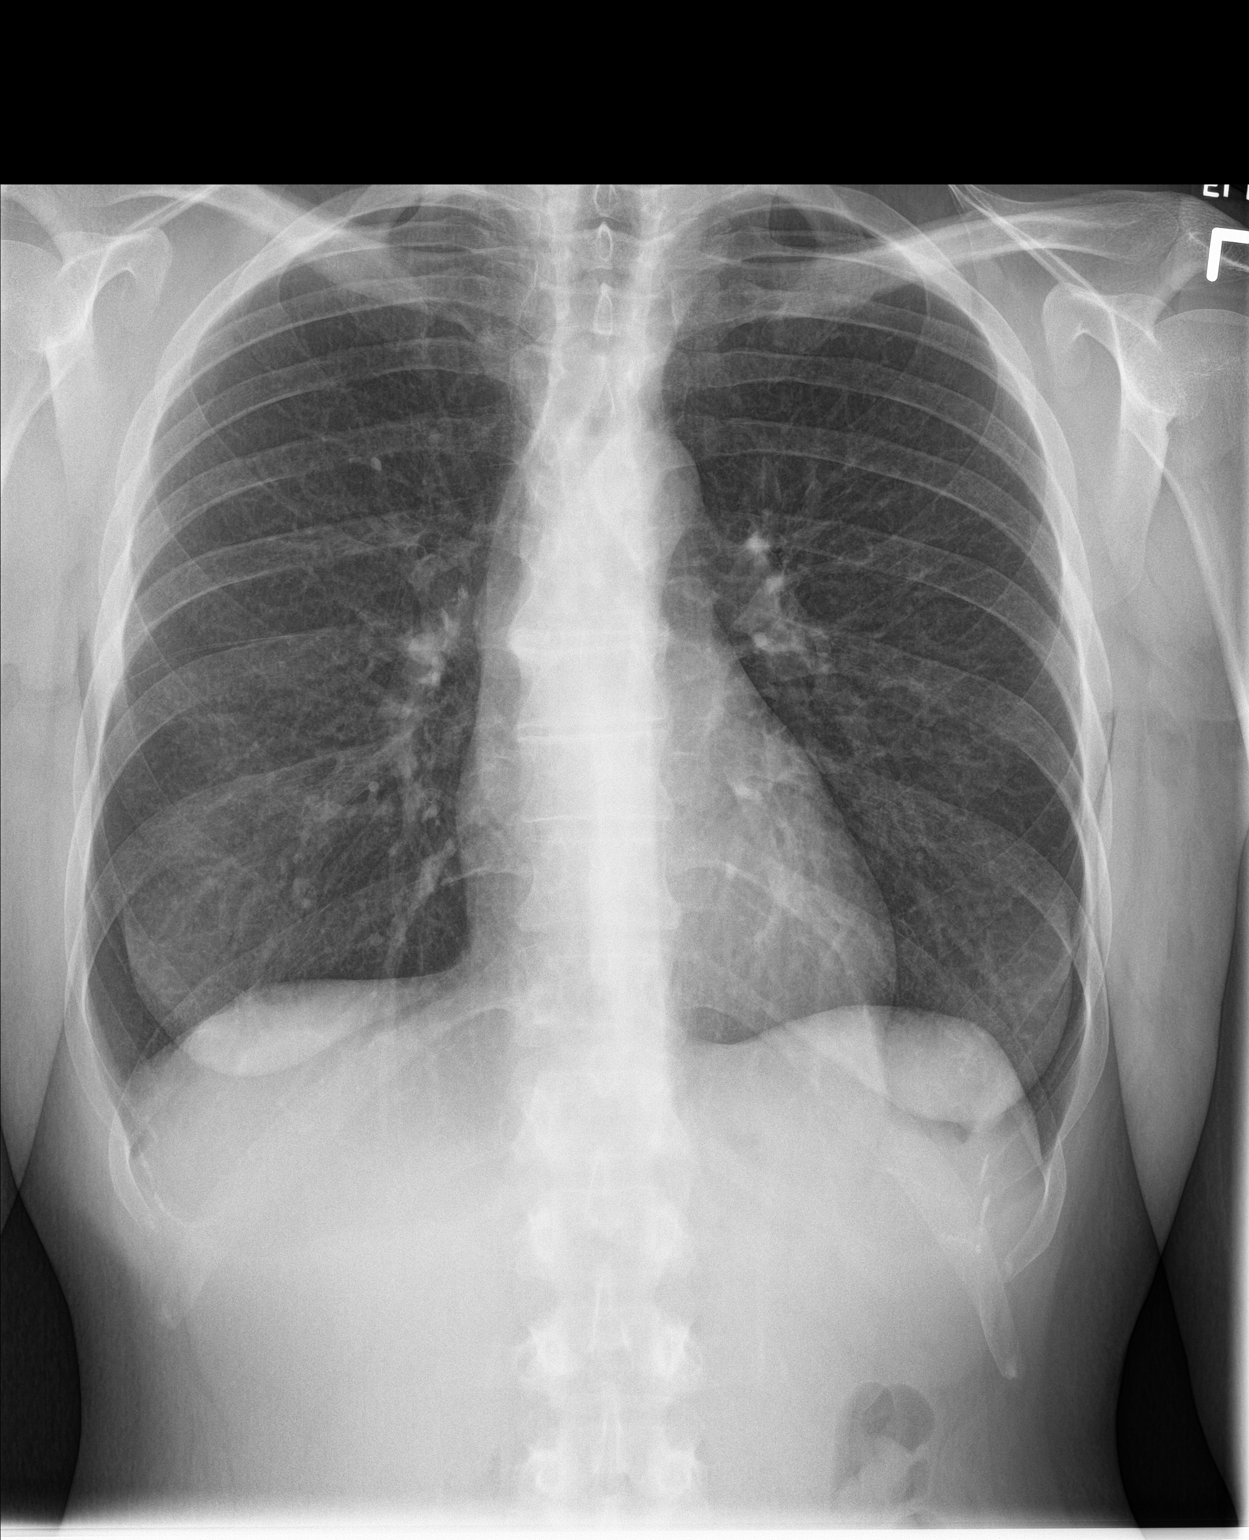

[chest lat]
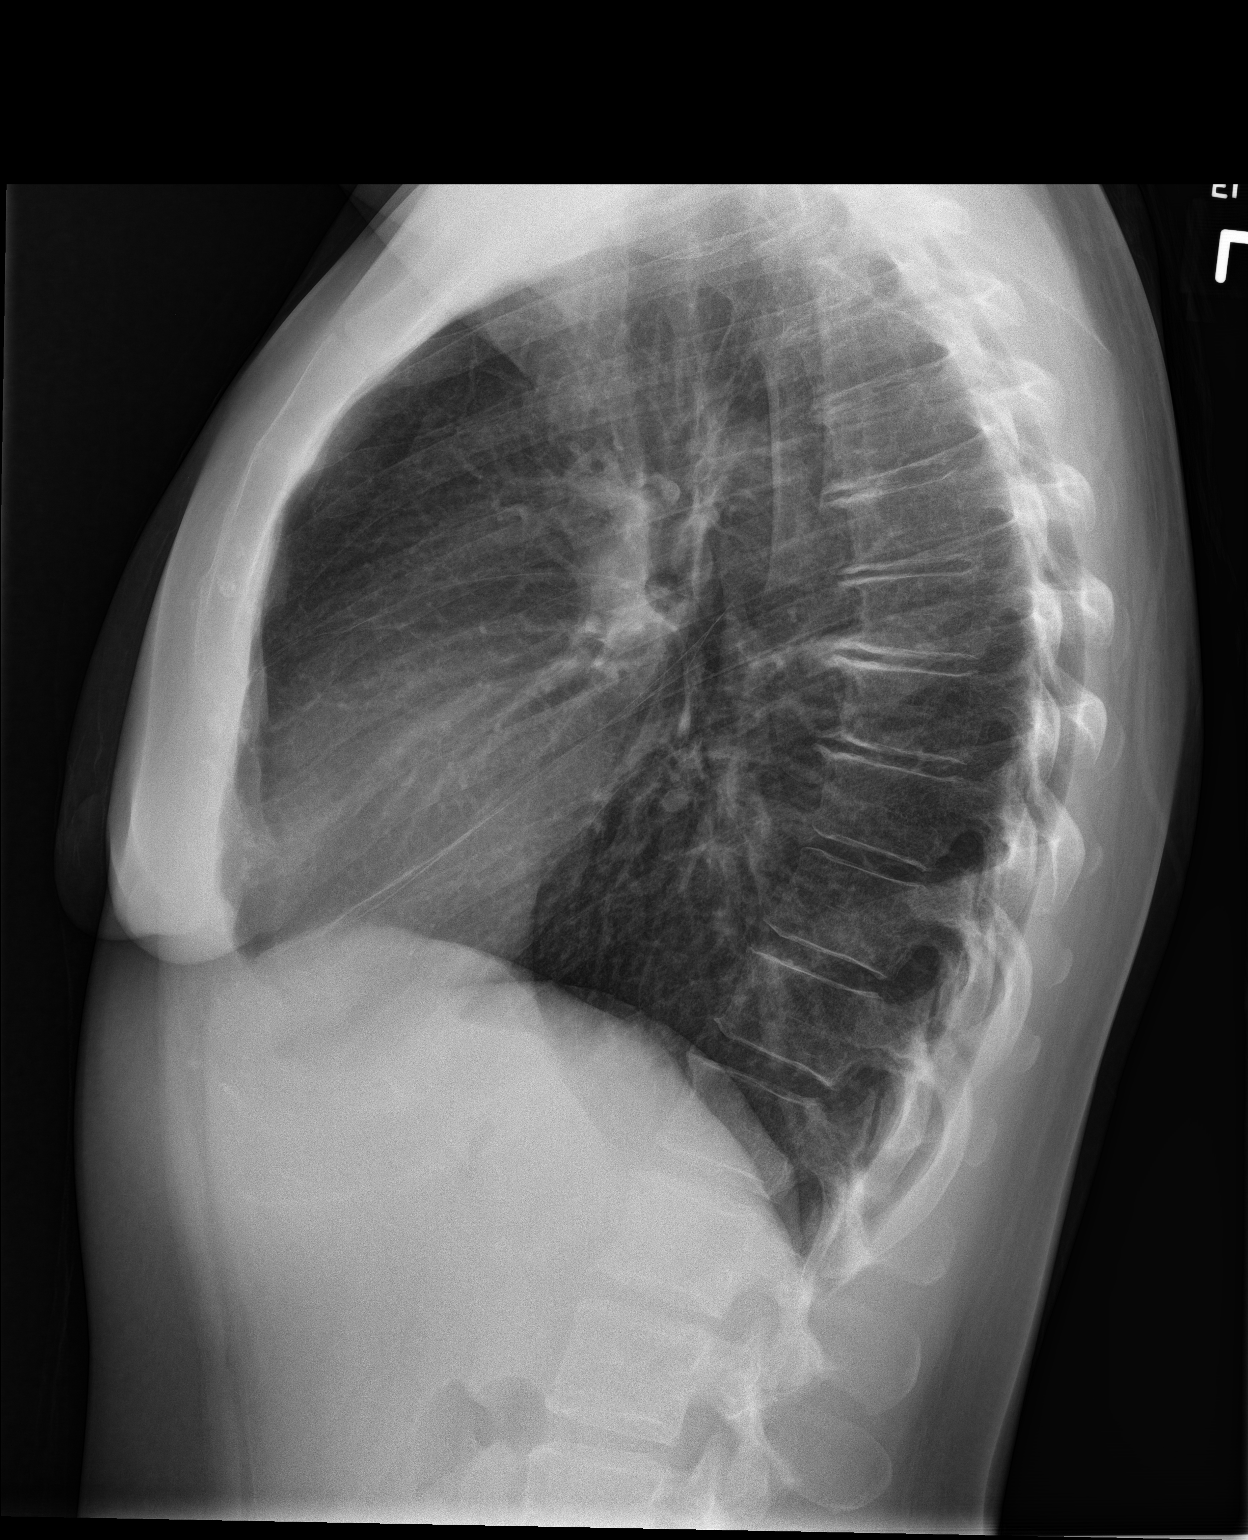

[2 of 2 positions shown; findings below may reference images not displayed]

FINDINGS: The heart size and mediastinal contours are within normal limits.
Both lungs are clear. The visualized skeletal structures are
unremarkable.
IMPRESSION: Normal exam.
